# Patient Record
Sex: Female | Born: 1980 | Race: Black or African American | Hispanic: No | Marital: Single | Smoking: Current every day smoker
Health system: Southern US, Community
[De-identification: ages and names within clinical notes are randomized; demographics above are authoritative.]

---

## 2000-12-30 ENCOUNTER — Emergency Department (HOSPITAL_COMMUNITY): Admission: EM | Admit: 2000-12-30 | Discharge: 2000-12-30 | Payer: Self-pay | Admitting: Emergency Medicine

## 2012-06-16 ENCOUNTER — Emergency Department (HOSPITAL_COMMUNITY)
Admission: EM | Admit: 2012-06-16 | Discharge: 2012-06-17 | Disposition: A | Discharge status: Home / Self Care | Payer: Self-pay | Attending: Emergency Medicine | Admitting: Emergency Medicine

## 2012-06-16 ENCOUNTER — Encounter (HOSPITAL_COMMUNITY): Payer: Self-pay | Admitting: *Deleted

## 2012-06-16 DIAGNOSIS — F17200 Nicotine dependence, unspecified, uncomplicated: Secondary | ICD-10-CM | POA: Insufficient documentation

## 2012-06-16 DIAGNOSIS — R110 Nausea: Secondary | ICD-10-CM | POA: Insufficient documentation

## 2012-06-16 DIAGNOSIS — N898 Other specified noninflammatory disorders of vagina: Secondary | ICD-10-CM | POA: Insufficient documentation

## 2012-06-16 DIAGNOSIS — N390 Urinary tract infection, site not specified: Secondary | ICD-10-CM | POA: Insufficient documentation

## 2012-06-16 DIAGNOSIS — A599 Trichomoniasis, unspecified: Secondary | ICD-10-CM | POA: Insufficient documentation

## 2012-06-16 DIAGNOSIS — Z3202 Encounter for pregnancy test, result negative: Secondary | ICD-10-CM | POA: Insufficient documentation

## 2012-06-16 LAB — URINALYSIS, ROUTINE W REFLEX MICROSCOPIC
Bilirubin Urine: NEGATIVE
Glucose, UA: NEGATIVE mg/dL
Protein, ur: NEGATIVE mg/dL
pH: 6 (ref 5.0–8.0)

## 2012-06-16 LAB — WET PREP, GENITAL
Clue Cells Wet Prep HPF POC: NONE SEEN
Yeast Wet Prep HPF POC: NONE SEEN

## 2012-06-16 LAB — URINE MICROSCOPIC-ADD ON

## 2012-06-16 NOTE — ED Notes (Signed)
Pt states she found out her boyfriend has another partner, been having abdominal pain, clear vaginal discharge and nausea x 1 week. Denies urinary symptoms, vomiting or diarrhea.

## 2012-06-16 NOTE — ED Provider Notes (Signed)
History     CSN: [id number]  Arrival date & time 06/16/12  1657   First MD Initiated Contact with Patient 06/16/12 2201      Chief Complaint  Patient presents with  . Abdominal Pain  . Nausea  . Vaginal Discharge    (Consider location/radiation/quality/duration/timing/severity/associated sxs/prior treatment) HPI Comments: Pt states that her boyfriend was recently tested for STDs and was found to have trichomonas that he contracted from another woman.   The patient is concerned that she may have an STD, as well.  Patient is a 32 y.o. female presenting with abdominal pain and vaginal discharge. The history is provided by the patient.  Abdominal Pain The primary symptoms of the illness include abdominal pain and vaginal discharge. The primary symptoms of the illness do not include fever, fatigue, nausea or dysuria.  The vaginal discharge is not associated with dysuria.  Symptoms associated with the illness do not include chills, diaphoresis, hematuria or frequency.  Vaginal Discharge Associated symptoms include abdominal pain. Pertinent negatives include no chills, diaphoresis, fatigue, fever or nausea.    History reviewed. No pertinent past medical history.  History reviewed. No pertinent past surgical history.  History reviewed. No pertinent family history.  History  Substance Use Topics  . Smoking status: Current Every Day Smoker  . Smokeless tobacco: Never Used  . Alcohol Use: No    OB History    Grav Para Term Preterm Abortions TAB SAB Ect Mult Living                  Review of Systems  Constitutional: Negative for fever, chills, diaphoresis and fatigue.  HENT: Negative.   Eyes: Negative.   Respiratory: Negative.   Cardiovascular: Negative.   Gastrointestinal: Positive for abdominal pain. Negative for nausea.  Genitourinary: Positive for vaginal discharge. Negative for dysuria, frequency, hematuria, flank pain, genital sores, vaginal pain and pelvic pain.    Musculoskeletal: Negative.   Neurological: Negative.   Psychiatric/Behavioral: Negative.   All other systems reviewed and are negative.    Allergies  Review of patient's allergies indicates no known allergies.  Home Medications  No current outpatient prescriptions on file.  BP 137/93  Pulse 101  Temp 98.3 F (36.8 C) (Oral)  Resp 18  SpO2 100%  LMP 06/02/2012  Physical Exam  Nursing note and vitals reviewed. Constitutional: She is oriented to person, place, and time. She appears well-developed and well-nourished. No distress.  HENT:  Head: Normocephalic and atraumatic.  Eyes: Conjunctivae normal are normal. Pupils are equal, round, and reactive to light.  Neck: Normal range of motion.  Cardiovascular: Normal rate and regular rhythm.   Pulmonary/Chest: Effort normal and breath sounds normal. No stridor.  Abdominal: Soft.       Pt states that she has abdominal pain, but on exam I did not find this.  Genitourinary: Uterus normal. Vaginal discharge found.       Some white frothy discharge in the vagina on exam.  Musculoskeletal: Normal range of motion. She exhibits no edema.  Neurological: She is alert and oriented to person, place, and time. No cranial nerve deficit.  Skin: Skin is warm and dry. She is not diaphoretic.  Psychiatric: She has a normal mood and affect. Her behavior is normal.    ED Course  Procedures (including critical care time)   Labs Reviewed  WET PREP, GENITAL  GC/CHLAMYDIA PROBE AMP  RPR  URINALYSIS, ROUTINE W REFLEX MICROSCOPIC   No results found.   No diagnosis found.  MDM  Urine reviewed, trichomonas and bacteria present.  Treating for both STD and UTI.  Recommend follow up for test of cure and use of barrier method for birth control.        Arman Filter, NP 06/17/12 [phone]

## 2012-06-17 LAB — GC/CHLAMYDIA PROBE AMP: CT Probe RNA: POSITIVE — AB

## 2012-06-17 MED ORDER — SULFAMETHOXAZOLE-TMP DS 800-160 MG PO TABS
1.0000 | ORAL_TABLET | Freq: Once | ORAL | Status: AC
  Administered 2012-06-17: 1 via ORAL
  Filled 2012-06-17: qty 1

## 2012-06-17 MED ORDER — METRONIDAZOLE 500 MG PO TABS
500.0000 mg | ORAL_TABLET | Freq: Once | ORAL | Status: AC
  Administered 2012-06-17: 500 mg via ORAL
  Filled 2012-06-17: qty 1

## 2012-06-17 MED ORDER — METRONIDAZOLE 500 MG PO TABS: 500.0000 mg | ORAL_TABLET | Freq: Two times a day (BID) | ORAL | Status: AC

## 2012-06-17 MED ORDER — SULFAMETHOXAZOLE-TMP DS 800-160 MG PO TABS: 1.0000 | ORAL_TABLET | Freq: Two times a day (BID) | ORAL | Status: AC

## 2012-06-17 NOTE — Discharge Instructions (Signed)
Urinary Tract Infection A urinary tract infection (UTI) is often caused by a germ (bacteria). A UTI is usually helped with medicine (antibiotics) that kills germs. Take all the medicine until it is gone. Do this even if you are feeling better. You are usually better in 7 to 10 days. HOME CARE   Drink enough water and fluids to keep your pee (urine) clear or pale yellow. Drink:  Cranberry juice.  Water.  Avoid:  Caffeine.  Tea.  Bubbly (carbonated) drinks.  Alcohol.  Only take medicine as told by your doctor.  To prevent further infections:  Pee often.  After pooping (bowel movement), women should wipe from front to back. Use each tissue only once.  Pee before and after having sex (intercourse). Ask your doctor when your test results will be ready. Make sure you follow up and get your test results.  GET HELP RIGHT AWAY IF:   There is very bad back pain or lower belly (abdominal) pain.  You get the chills.  You have a fever.  Your baby is older than 3 months with a rectal temperature of 102 F (38.9 C) or higher.  Your baby is 27 months old or younger with a rectal temperature of 100.4 F (38 C) or higher.  You feel sick to your stomach (nauseous) or throw up (vomit).  There is continued burning with peeing.  Your problems are not better in 3 days. Return sooner if you are getting worse. MAKE SURE YOU:   Understand these instructions.  Will watch your condition.  Will get help right away if you are not doing well or get worse. Document Released: 11/06/2007 Document Revised: 08/12/2011 Document Reviewed: 11/06/2007 [facility] Patient Information 2013 [place], [place]. Trichomoniasis Trichomoniasis is an infection, caused by the Trichomonas organism, that affects both women and men. In women, the outer female genitalia and the vagina are affected. In men, the penis is mainly affected, but the prostate and other reproductive organs can also be involved.  Trichomoniasis is a sexually transmitted disease (STD) and is most often passed to another person through sexual contact. The majority of people who get trichomoniasis do so from a sexual encounter and are also at risk for other STDs. CAUSES   Sexual intercourse with an infected partner.  It can be present in swimming pools or hot tubs. SYMPTOMS   Abnormal gray-green frothy vaginal discharge in women.  Vaginal itching and irritation in women.  Itching and irritation of the area outside the vagina in women.  Penile discharge with or without pain in males.  Inflammation of the urethra (urethritis), causing painful urination.  Bleeding after sexual intercourse. RELATED COMPLICATIONS  Pelvic inflammatory disease.  Infection of the uterus (endometritis).  Infertility.  Tubal (ectopic) pregnancy.  It can be associated with other STDs, including gonorrhea and chlamydia, hepatitis B, and HIV. COMPLICATIONS DURING PREGNANCY  Early (premature) delivery.  Premature rupture of the membranes (PROM).  Low birth weight. DIAGNOSIS   Visualization of Trichomonas under the microscope from the vagina discharge.  Ph of the vagina greater than 4.5, tested with a test tape.  Trich Rapid Test.  Culture of the organism, but this is not usually needed.  It may be found on a Pap test.  Having a "strawberry cervix,"which means the cervix looks very red like a strawberry. TREATMENT   You may be given medication to fight the infection. Inform your caregiver if you could be or are pregnant. Some medications used to treat the infection should not be  taken during pregnancy.  Over-the-counter medications or creams to decrease itching or irritation may be recommended.  Your sexual partner will need to be treated if infected. HOME CARE INSTRUCTIONS   Take all medication prescribed by your caregiver.  Take over-the-counter medication for itching or irritation as directed by your  caregiver.  Do not have sexual intercourse while you have the infection.  Do not douche or wear tampons.  Discuss your infection with your partner, as your partner may have acquired the infection from you. Or, your partner may have been the person who transmitted the infection to you.  Have your sex partner examined and treated if necessary.  Practice safe, informed, and protected sex.  See your caregiver for other STD testing. SEEK MEDICAL CARE IF:   You still have symptoms after you finish the medication.  You have an oral temperature above 102 F (38.9 C).  You develop belly (abdominal) pain.  You have pain when you urinate.  You have bleeding after sexual intercourse.  You develop a rash.  The medication makes you sick or makes you throw up (vomit). Document Released: 11/13/2000 Document Revised: 08/12/2011 Document Reviewed: 12/09/2008 [facility] Patient Information 2013 [place], [place]. Trichomoniasis Trichomoniasis is an infection, caused by the Trichomonas organism, that affects both women and men. In women, the outer female genitalia and the vagina are affected. In men, the penis is mainly affected, but the prostate and other reproductive organs can also be involved. Trichomoniasis is a sexually transmitted disease (STD) and is most often passed to another person through sexual contact. The majority of people who get trichomoniasis do so from a sexual encounter and are also at risk for other STDs. CAUSES   Sexual intercourse with an infected partner.  It can be present in swimming pools or hot tubs. SYMPTOMS   Abnormal gray-green frothy vaginal discharge in women.  Vaginal itching and irritation in women.  Itching and irritation of the area outside the vagina in women.  Penile discharge with or without pain in males.  Inflammation of the urethra (urethritis), causing painful urination.  Bleeding after sexual intercourse. RELATED COMPLICATIONS  Pelvic  inflammatory disease.  Infection of the uterus (endometritis).  Infertility.  Tubal (ectopic) pregnancy.  It can be associated with other STDs, including gonorrhea and chlamydia, hepatitis B, and HIV. COMPLICATIONS DURING PREGNANCY  Early (premature) delivery.  Premature rupture of the membranes (PROM).  Low birth weight. DIAGNOSIS   Visualization of Trichomonas under the microscope from the vagina discharge.  Ph of the vagina greater than 4.5, tested with a test tape.  Trich Rapid Test.  Culture of the organism, but this is not usually needed.  It may be found on a Pap test.  Having a "strawberry cervix,"which means the cervix looks very red like a strawberry. TREATMENT   You may be given medication to fight the infection. Inform your caregiver if you could be or are pregnant. Some medications used to treat the infection should not be taken during pregnancy.  Over-the-counter medications or creams to decrease itching or irritation may be recommended.  Your sexual partner will need to be treated if infected. HOME CARE INSTRUCTIONS   Take all medication prescribed by your caregiver.  Take over-the-counter medication for itching or irritation as directed by your caregiver.  Do not have sexual intercourse while you have the infection.  Do not douche or wear tampons.  Discuss your infection with your partner, as your partner may have acquired the infection from you. Or, your partner  may have been the person who transmitted the infection to you.  Have your sex partner examined and treated if necessary.  Practice safe, informed, and protected sex.  See your caregiver for other STD testing. SEEK MEDICAL CARE IF:   You still have symptoms after you finish the medication.  You have an oral temperature above 102 F (38.9 C).  You develop belly (abdominal) pain.  You have pain when you urinate.  You have bleeding after sexual intercourse.  You develop a  rash.  The medication makes you sick or makes you throw up (vomit). Document Released: 11/13/2000 Document Revised: 08/12/2011 Document Reviewed: 12/09/2008 [facility] Patient Information 2013 [place], [place]. RESOURCE GUIDE  Chronic Pain Problems: Contact [facility]  [phone] Patients need to be referred by their primary care doctor.  Insufficient Money for Medicine: Contact United Way:  call "211."   No Primary Care Doctor: - Call Health Connect  [phone] - can help you locate a primary care doctor that  accepts your insurance, provides certain services, etc. - Physician Referral Service- [phone]  Agencies that provide inexpensive medical care: - [facility]  [phone] - [facility]  [phone] - [facility]  [phone] - Women's Clinic  [phone] - Planned Parenthood  [phone] - [facility]  [phone]  Medicaid-accepting [facility] Providers: - [facility]- [address]  [phone], Mon-Fri 9am-7pm, Sat 9am-1pm - [facility]- [address] [place], Suite [place]  [phone] - [facility]- [address], [address] [place]  [phone] [facility]- [address]  [phone] - Renaye Rakers- [address] [place], [address], [phone]  Only accepts [place] patients after they have their name  applied to their card  Self Pay (no insurance) in [place]: - Sickle Cell Patients: Dr Willey Blade, [facility]  [address] [place], [phone] - [facility]- [address] [place]  [phone]       [facility]- [address]       -     [facility]- see information above (Speak to [employer] if you do not have insurance)       -  [facility]- [address],  [phone]       -  [facility]- [address]  [phone]       -  Dr Julio Sicks-  [address], [place], [phone]       -  Urgent Medical and [facility] - [address], [phone]       -  [facility]- [address] [phone], also [address], [phone]       -    [facility]- [address] [place], [phone], 1st & 3rd Saturday        every month, 10am-1pm  1) Find a Doctor and Pay Out of Pocket Although you won't have to find out who is covered by your insurance plan, it is a good idea to ask around and get recommendations. You will then need to call the office and see if the doctor you have chosen will accept you as a new patient and what types of options they offer for patients who are self-pay. Some doctors offer discounts  or will set up payment plans for their patients who do not have insurance, but you will need to ask so you aren't surprised when you get to your appointment.  2) Contact Your Local Health Department Not all health departments have doctors that can see patients for sick visits, but many do, so it is worth a call to see if yours does. If you don't know where your local health department is, you can check in your phone book. The CDC also has a tool to help you locate your state's health department, and many state websites also have listings of all of their local health departments.  3) Find a Walk-in Clinic If your illness is not likely to be very severe or complicated, you may want to try a walk in clinic. These are popping up all over the country in pharmacies, drugstores, and shopping centers. They're usually staffed by nurse practitioners or physician assistants that have been trained to treat common illnesses and complaints. They're usually fairly quick and inexpensive. However, if you have serious medical issues or chronic medical problems, these are probably not your best option  STD Testing - [facility] Department of [facility], STD  Clinic, [address], [place], phone [phone] or [phone].  Monday - Friday, call for an appointment. [facility] Department of [employer], STD Clinic, [address], [place], phone [phone] or [phone].  Monday - Friday, call for an appointment.  Abuse/Neglect: [facility] Child Abuse Hotline (684)273-7503 [facility] Child Abuse Hotline 657-280-7313 (After Hours)  Emergency Shelter:  [facility] [phone]) [phone]  Maternity Homes: - Room at the [place] [facility] [phone] - [facility] [phone]  MRSA Hotline #:   440 669 4400  [facility] Resources [facility]  United Way [facility] Dept. [address]                 [address]         [address] [place] [address]  Blondell Reveal Phone:  [phone]                                  Phone:  [phone]                   Phone:  [phone]  [facility], [phone] - [facility]- [phone]       -     [facility] in [place], [address]             [phone], Insurance  [place] Child Abuse Hotline 702 602 8686 or (989)125-1609 (After Hours)  Dental Assistance  If unable to pay or uninsured, contact:  [facility]. to become qualified for the adult dental clinic.  Patients with Medicaid: [facility] [phone] [address] Joellyn Quails, 5512077523 [address] [phone]  If unable to pay, or uninsured, contact [facility] [phone] in [place], [phone] in [facility]) to become qualified for the adult dental clinic  Other Low-Cost Community Dental Services: - [facility]- [address] [place], [place], [place], [address], [phone], Ext. [address], 2nd and 4th  Thursday of the month at 6:30am.  10 clients each day by appointment, can sometimes see walk-in patients if someone does not show for an appointment. [facility]- [address] [place], [place], [address], [phone] - [facility] [address], [place], [place], [address], [phone] - [facility]- [phone] [facility]- [phone] [facility] Health [phone]       Behavioral Health Resources in the [facility]  Intensive Outpatient Programs: [facility]      [address] [place], [place] [phone] Both a day and evening program       [facility] Outpatient     [address]        [place], [place] [address] [phone]         ADS: [facility] [address] [place] [phone]  [facility] Mental Health ACCESS LINE: 228-013-9574 or ([phone] [place], [place] [address] [email or web address]  Behavioral Health Services  Substance Abuse Resources: - [facility] Drug [facility]  [phone] - [facility] [phone] - [facility] [place] [phone] Floydene Flock [phone] - Residential & Outpatient Substance Abuse Program  [phone]  Psychological Services: [facility]  [phone] [facility] Services  [phone] - [facility], [phone] [place]. [address], [place], ACCESS LINE: 6362410956 or [phone], [email or web address]  Mobile Crisis Teams:                                        Therapeutic Alternatives         Mobile Crisis Care Unit [phone]             [facility] [address] Ginette Otto [phone]                                         Interventionist [address] [place] [address] [place]  [place] [phone]  Self-Help/Support Groups: Mental Health Assoc. of [employer] of support groups 641-824-2022 (call for more info)   Narcotics Anonymous (NA) Caring Services [address] [place] - 2 meetings at this location  Residential Treatment Programs:  [facility]      [address]        [place]       [phone]         [facility] [address] [place] [address] [place], [place]  [address] [phone]  [facility]  [address] [place], [place] [address] [phone] Admissions: 8am-3pm M-F  [facility]     [address]        [place], [place] [address]       [phone]         [facility] [address] [place], [place] [phone]  [facility] [address] [place], [place] [phone]  [facility] -  Intensive Outpatient      [address]     [place], [place]       [phone]         [facility] ([facility]     [address] [place], [place] [phone] or [phone]  Residential Treatment Services (RTS), Medicaid [address] [place], [place] [phone]  [facility] [address]                                               [address] [place] [phone]  [facility] Resources: [place] [phone]               General Therapy                                                Angie Fava, PhD        [address] [place], [place] [address]         [phone]   Insurance  [facility]   [address] [place], [place] [address] [phone]  [facility] [address] [place], [place] [address] [phone] Insurance/Medicaid/sponsorship through [facility] and Families                                              [address]                                         [place], [place] [address]    Therapy/tele-psych/case         [phone]          [facility] [address] [place], [place]  [address]  Adolescent/group home/case management [phone]                                           Creola Corn PhD       General therapy       Insurance   [phone]         Dr. Lolly Mustache, Insurance, M-F [phone]   While your infection is active, please use a barrier method of contraception (ie condom) with each encounter until you have followed up with health department or PCP for test of cure.

## 2012-06-17 NOTE — ED Provider Notes (Signed)
Medical screening examination/treatment/procedure(s) were performed by non-physician practitioner and as supervising physician I was immediately available for consultation/collaboration.   Sunnie Nielsen, MD 06/17/12 0100

## 2012-06-18 NOTE — ED Notes (Signed)
+  Chlamydia Chart sent to EDP office for review.  

## 2012-06-19 LAB — URINE CULTURE

## 2012-06-20 NOTE — ED Notes (Signed)
+  Urine. Patient treated with Bactrim DS. Sensitive to same. Per protocol MD. °

## 2012-06-24 NOTE — ED Notes (Signed)
Chart returned from EDP office . rx for zithromax 2 gm po x 1 per Margaret Doyle.

## 2012-06-27 ENCOUNTER — Telehealth (HOSPITAL_COMMUNITY): Payer: Self-pay | Admitting: Emergency Medicine

## 2012-06-28 NOTE — ED Notes (Signed)
Unable to contact patient via phone. Sent letter. °

## 2012-08-23 ENCOUNTER — Telehealth (HOSPITAL_COMMUNITY): Payer: Self-pay | Admitting: Emergency Medicine

## 2012-08-23 NOTE — ED Notes (Signed)
No response to letter sent after 30 days. Chart sent to Medical Records. °

## 2015-03-15 ENCOUNTER — Encounter (HOSPITAL_COMMUNITY): Payer: Self-pay | Admitting: Emergency Medicine

## 2015-03-15 ENCOUNTER — Emergency Department (HOSPITAL_COMMUNITY)
Admission: EM | Admit: 2015-03-15 | Discharge: 2015-03-15 | Disposition: A | Discharge status: Home / Self Care | Payer: Self-pay | Attending: Emergency Medicine | Admitting: Emergency Medicine

## 2015-03-15 ENCOUNTER — Emergency Department (HOSPITAL_COMMUNITY): Payer: Self-pay

## 2015-03-15 DIAGNOSIS — Z792 Long term (current) use of antibiotics: Secondary | ICD-10-CM | POA: Insufficient documentation

## 2015-03-15 DIAGNOSIS — Y998 Other external cause status: Secondary | ICD-10-CM | POA: Insufficient documentation

## 2015-03-15 DIAGNOSIS — Z720 Tobacco use: Secondary | ICD-10-CM | POA: Insufficient documentation

## 2015-03-15 DIAGNOSIS — W208XXA Other cause of strike by thrown, projected or falling object, initial encounter: Secondary | ICD-10-CM | POA: Insufficient documentation

## 2015-03-15 DIAGNOSIS — Y9389 Activity, other specified: Secondary | ICD-10-CM | POA: Insufficient documentation

## 2015-03-15 DIAGNOSIS — Y9289 Other specified places as the place of occurrence of the external cause: Secondary | ICD-10-CM | POA: Insufficient documentation

## 2015-03-15 DIAGNOSIS — S96912A Strain of unspecified muscle and tendon at ankle and foot level, left foot, initial encounter: Secondary | ICD-10-CM | POA: Insufficient documentation

## 2015-03-15 MED ORDER — IBUPROFEN 800 MG PO TABS: 800.0000 mg | ORAL_TABLET | Freq: Three times a day (TID) | ORAL | Status: AC

## 2015-03-15 MED ORDER — TRAMADOL HCL 50 MG PO TABS: 50.0000 mg | ORAL_TABLET | Freq: Two times a day (BID) | ORAL | Status: AC | PRN

## 2015-03-15 MED ORDER — HYDROCODONE-ACETAMINOPHEN 5-325 MG PO TABS
2.0000 | ORAL_TABLET | Freq: Once | ORAL | Status: AC
  Administered 2015-03-15: 2 via ORAL
  Filled 2015-03-15: qty 2

## 2015-03-15 NOTE — Discharge Instructions (Signed)
Ankle Sprain  An ankle sprain is an injury to the strong, fibrous tissues (ligaments) that hold the bones of your ankle joint together.   CAUSES  An ankle sprain is usually caused by a fall or by twisting your ankle. Ankle sprains most commonly occur when you step on the outer edge of your foot, and your ankle turns inward. People who participate in sports are more prone to these types of injuries.   SYMPTOMS    Pain in your ankle. The pain may be present at rest or only when you are trying to stand or walk.   Swelling.   Bruising. Bruising may develop immediately or within 1 to 2 days after your injury.   Difficulty standing or walking, particularly when turning corners or changing directions.  DIAGNOSIS   Your caregiver will ask you details about your injury and perform a physical exam of your ankle to determine if you have an ankle sprain. During the physical exam, your caregiver will press on and apply pressure to specific areas of your foot and ankle. Your caregiver will try to move your ankle in certain ways. An X-ray exam may be done to be sure a bone was not broken or a ligament did not separate from one of the bones in your ankle (avulsion fracture).   TREATMENT   Certain types of braces can help stabilize your ankle. Your caregiver can make a recommendation for this. Your caregiver may recommend the use of medicine for pain. If your sprain is severe, your caregiver may refer you to a surgeon who helps to restore function to parts of your skeletal system (orthopedist) or a physical therapist.  HOME CARE INSTRUCTIONS    Apply ice to your injury for 1-2 days or as directed by your caregiver. Applying ice helps to reduce inflammation and pain.    Put ice in a plastic bag.    Place a towel between your skin and the bag.    Leave the ice on for 15-20 minutes at a time, every 2 hours while you are awake.   Only take over-the-counter or prescription medicines for pain, discomfort, or fever as directed by  your caregiver.   Elevate your injured ankle above the level of your heart as much as possible for 2-3 days.   If your caregiver recommends crutches, use them as instructed. Gradually put weight on the affected ankle. Continue to use crutches or a cane until you can walk without feeling pain in your ankle.   If you have a plaster splint, wear the splint as directed by your caregiver. Do not rest it on anything harder than a pillow for the first 24 hours. Do not put weight on it. Do not get it wet. You may take it off to take a shower or bath.   You may have been given an elastic bandage to wear around your ankle to provide support. If the elastic bandage is too tight (you have numbness or tingling in your foot or your foot becomes cold and blue), adjust the bandage to make it comfortable.   If you have an air splint, you may blow more air into it or let air out to make it more comfortable. You may take your splint off at night and before taking a shower or bath. Wiggle your toes in the splint several times per day to decrease swelling.  SEEK MEDICAL CARE IF:    You have rapidly increasing bruising or swelling.   Your toes feel   extremely cold or you lose feeling in your foot.   Your pain is not relieved with medicine.  SEEK IMMEDIATE MEDICAL CARE IF:   Your toes are numb or blue.   You have severe pain that is increasing.  MAKE SURE YOU:    Understand these instructions.   Will watch your condition.   Will get help right away if you are not doing well or get worse.     This information is not intended to replace advice given to you by your health care provider. Make sure you discuss any questions you have with your health care provider.     Document Released: 05/20/2005 Document Revised: 06/10/2014 Document Reviewed: 06/01/2011  Elsevier Interactive Patient Education 2016 [employer]

## 2015-03-15 NOTE — ED Provider Notes (Signed)
CSN: [id number]     Arrival date & time 03/15/15  1708 History  By signing my name below, I, Margaret Doyle, attest that this documentation has been prepared under the direction and in the presence of Margaret BerryLeisa Elly Haffey, PA-C. Electronically Signed: Evon Slackerrance Doyle, ED Scribe. 03/15/2015. 6:19 PM.  Chief Complaint  Patient presents with  . Ankle Pain   The history is provided by the patient. No language interpreter was used.    HPI Comments: Margaret GurneyRayshaune Doyle Margaret Doyle is a 34 y.o. female who presents to the Emergency Department complaining of left ankle injury onset 1 night prior. Pt rates the the severity of her pain 8/10. Pt states she was moving furniture last night and she dropped a dresser on her left ankle. Pt presents with associated swelling. Pt states she has tried a compression wrap with no relief. Pt states she has also tried an epsom salt bath and icing the ankle with slight relief. Pt states she is able to bear weight but states that makes the pain worse. Pt denies any medications PTA. Pt denies numbness or tingling.    History reviewed. No pertinent past medical history. History reviewed. No pertinent past surgical history. No family history on file. Social History  Substance Use Topics  . Smoking status: Current Every Day Smoker  . Smokeless tobacco: Never Used  . Alcohol Use: No   OB History    No data available     Review of Systems  Musculoskeletal: Positive for joint swelling and arthralgias. Negative for gait problem.  Neurological: Negative for numbness.    Allergies  Review of patient's allergies indicates no known allergies.  Home Medications   Prior to Admission medications   Medication Sig Start Date End Date Taking? Authorizing Provider  ibuprofen (ADVIL,MOTRIN) 800 MG tablet Take 1 tablet (800 mg total) by mouth 3 (three) times daily. 03/15/15   Margaret BerryLeisa Carliss Quast, PA-C  metroNIDAZOLE (FLAGYL) 500 MG tablet Take 1 tablet (500 mg total) by mouth 2 (two) times daily. 06/17/12    Earley FavorGail Schulz, NP  sulfamethoxazole-trimethoprim (BACTRIM DS) 800-160 MG per tablet Take 1 tablet by mouth 2 (two) times daily. 06/17/12   Earley FavorGail Schulz, NP  traMADol (ULTRAM) 50 MG tablet Take 1 tablet (50 mg total) by mouth every 12 (twelve) hours as needed for severe pain. 03/15/15   Jannelle Notaro, PA-C   BP 130/69 mmHg  Pulse 93  Temp(Src) 98.6 F (37 C) (Oral)  Resp 20  SpO2 97%  LMP 02/16/2015  Physical Exam  Constitutional: She is oriented to person, place, and time. She appears well-developed and well-nourished. No distress.  HENT:  Head: Normocephalic and atraumatic.  Right Ear: External ear normal.  Left Ear: External ear normal.  Nose: Nose normal.  Mouth/Throat: Oropharynx is clear and moist. No oropharyngeal exudate.  Eyes: Conjunctivae and EOM are normal. Pupils are equal, round, and reactive to light. Right eye exhibits no discharge. Left eye exhibits no discharge. No scleral icterus.  Neck: Normal range of motion. Neck supple. No JVD present. No tracheal deviation present.  Cardiovascular: Normal rate and regular rhythm.   Pulmonary/Chest: Effort normal and breath sounds normal. No stridor. No respiratory distress.  Musculoskeletal: Normal range of motion. She exhibits tenderness. She exhibits no edema.       Left ankle: She exhibits normal range of motion, no swelling, no ecchymosis, no deformity, no laceration and normal pulse. No tenderness. No lateral malleolus and no medial malleolus tenderness found. Achilles tendon normal. Achilles tendon exhibits no pain and  no defect.       Feet:  ttp to lateral ankle proximal to lateral malleolus, no swelling, erythema, deformity  Lymphadenopathy:    She has no cervical adenopathy.  Neurological: She is alert and oriented to person, place, and time. She exhibits normal muscle tone. Coordination normal.  Skin: Skin is warm and dry. No rash noted. She is not diaphoretic. No erythema. No pallor.  Psychiatric: She has a normal mood and  affect. Her behavior is normal. Judgment and thought content normal.  Nursing note and vitals reviewed.  ED Course  Procedures  DIAGNOSTIC STUDIES: Oxygen Saturation is 97% on RA, normal by my interpretation.    COORDINATION OF CARE: 6:23 PM-Discussed treatment plan with pt at bedside and pt agreed to plan.    Labs Review Labs Reviewed - No data to display  Imaging Review No results found.      DG Ankle Complete Left (Final result) Result time: 03/15/15 18:03:03   Final result by Rad Results In Interface (03/15/15 18:03:03)   Narrative:   CLINICAL DATA: Left ankle pain after blunt trauma yesterday. Swelling.  EXAM: LEFT ANKLE COMPLETE - 3+ VIEW  COMPARISON: None.  FINDINGS: There is no evidence of fracture, dislocation, or joint effusion. There is no evidence of arthropathy or other focal bone abnormality. Soft tissues are unremarkable.  IMPRESSION: Negative.   Electronically Signed By: Margaret Doyle M.D. On: 03/15/2015 18:03    I have personally reviewed and evaluated these images as part of my medical decision-making.   EKG Interpretation None      MDM   Final diagnoses:  Ankle strain, left, initial encounter    Left ankle strain, no fx on xray RICE tx reviewed, given ankle brace, crutches and work note Encouraged follow up with PCP if not improved in 1 week  I personally performed the services described in this documentation, which was scribed in my presence. The recorded information has been reviewed and is accurate.    Margaret Berry, PA-C 03/24/15 0110  Margaret Jester, DO 03/24/15 1610

## 2015-03-15 NOTE — ED Notes (Signed)
Pt states she dropped a dresser on her left ankle yesterday. Pain mostly in lower ankle, radiating up high ankle, rates pain 8/10. Mild swelling noted, pedal pulse intact, denies numbness/tingling.

## 2016-08-06 ENCOUNTER — Encounter (HOSPITAL_COMMUNITY): Payer: Self-pay | Admitting: Emergency Medicine

## 2016-08-06 ENCOUNTER — Emergency Department (HOSPITAL_COMMUNITY)
Admission: EM | Admit: 2016-08-06 | Discharge: 2016-08-06 | Disposition: A | Discharge status: Home / Self Care | Payer: Self-pay | Attending: Emergency Medicine | Admitting: Emergency Medicine

## 2016-08-06 DIAGNOSIS — Y999 Unspecified external cause status: Secondary | ICD-10-CM | POA: Insufficient documentation

## 2016-08-06 DIAGNOSIS — Y929 Unspecified place or not applicable: Secondary | ICD-10-CM | POA: Insufficient documentation

## 2016-08-06 DIAGNOSIS — S0012XA Contusion of left eyelid and periocular area, initial encounter: Secondary | ICD-10-CM | POA: Insufficient documentation

## 2016-08-06 DIAGNOSIS — S060X0A Concussion without loss of consciousness, initial encounter: Secondary | ICD-10-CM | POA: Insufficient documentation

## 2016-08-06 DIAGNOSIS — F17200 Nicotine dependence, unspecified, uncomplicated: Secondary | ICD-10-CM | POA: Insufficient documentation

## 2016-08-06 DIAGNOSIS — Y939 Activity, unspecified: Secondary | ICD-10-CM | POA: Insufficient documentation

## 2016-08-06 MED ORDER — KETOROLAC TROMETHAMINE 30 MG/ML IJ SOLN
30.0000 mg | Freq: Once | INTRAMUSCULAR | Status: AC
  Administered 2016-08-06: 30 mg via INTRAVENOUS
  Filled 2016-08-06: qty 1

## 2016-08-06 MED ORDER — METOCLOPRAMIDE HCL 10 MG PO TABS: 10.0000 mg | ORAL_TABLET | Freq: Three times a day (TID) | ORAL | 0 refills | Status: AC | PRN

## 2016-08-06 MED ORDER — DIPHENHYDRAMINE HCL 50 MG/ML IJ SOLN
50.0000 mg | Freq: Once | INTRAMUSCULAR | Status: AC
  Administered 2016-08-06: 50 mg via INTRAVENOUS
  Filled 2016-08-06: qty 1

## 2016-08-06 MED ORDER — METOCLOPRAMIDE HCL 5 MG/ML IJ SOLN
10.0000 mg | Freq: Once | INTRAMUSCULAR | Status: AC
  Administered 2016-08-06: 10 mg via INTRAVENOUS
  Filled 2016-08-06: qty 2

## 2016-08-06 NOTE — ED Notes (Signed)
Provider in room  
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
## 2017-04-28 IMAGING — CR DG ANKLE COMPLETE 3+V*L*
3 series · 3 of 3 positions shown · non-contrast
Comparison: None.

CLINICAL DATA: Left ankle pain after blunt trauma yesterday.
Swelling.

EXAM:
LEFT ANKLE COMPLETE - 3+ VIEW

[x ankle ap left]
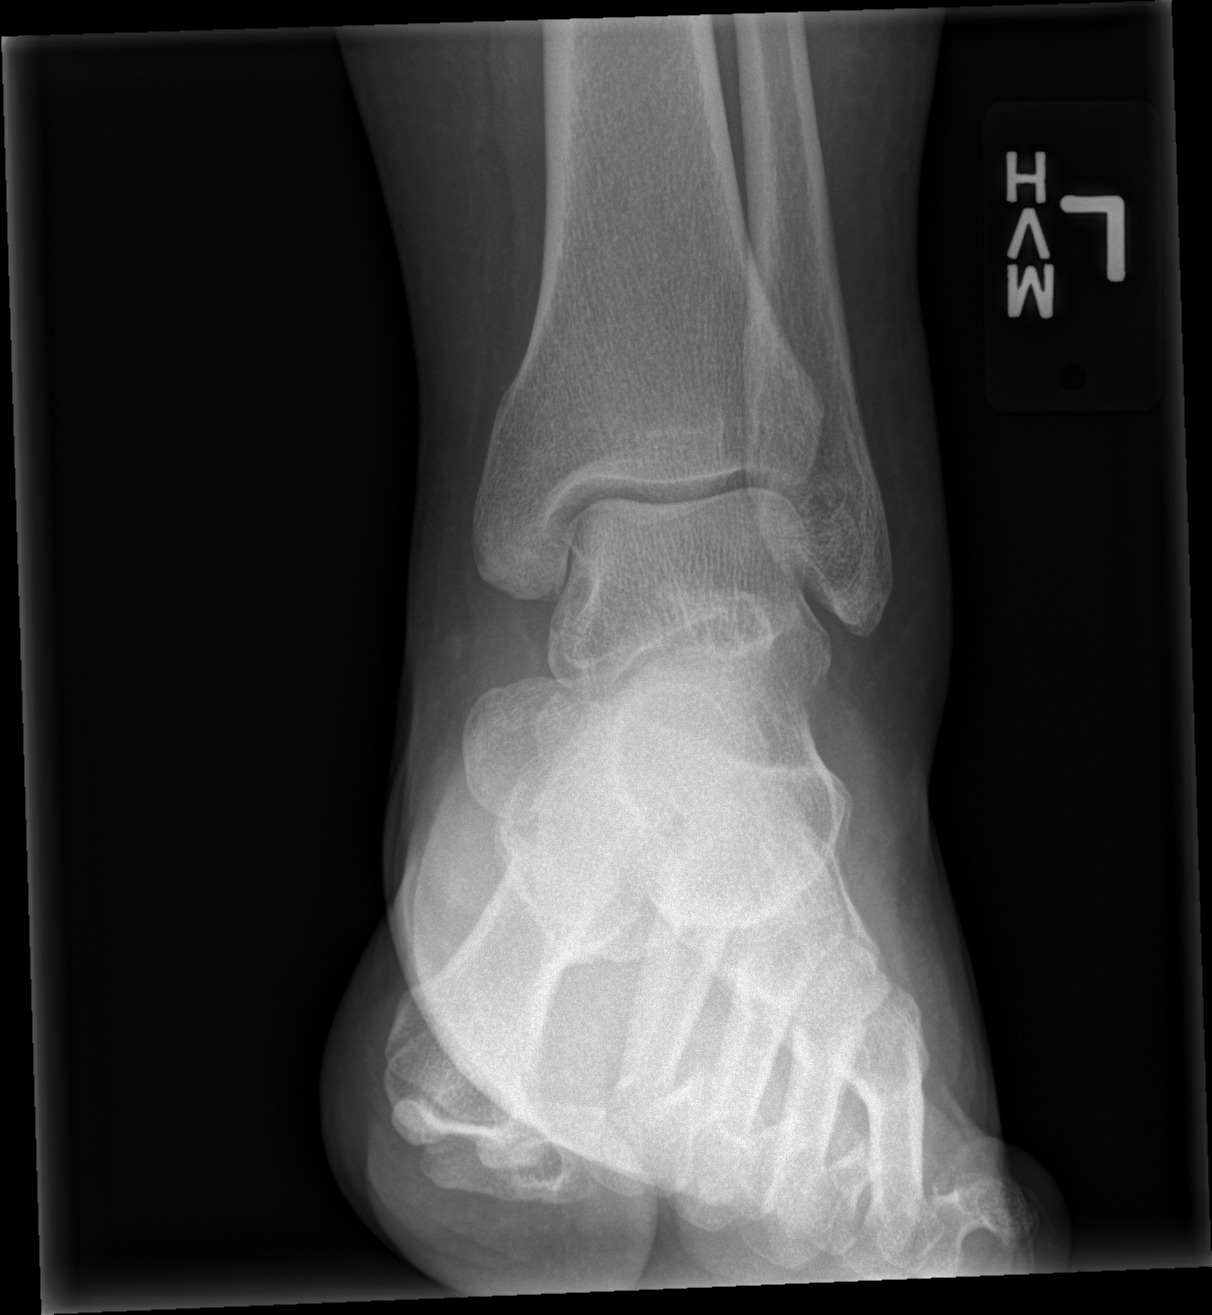

[x ankle obl left]
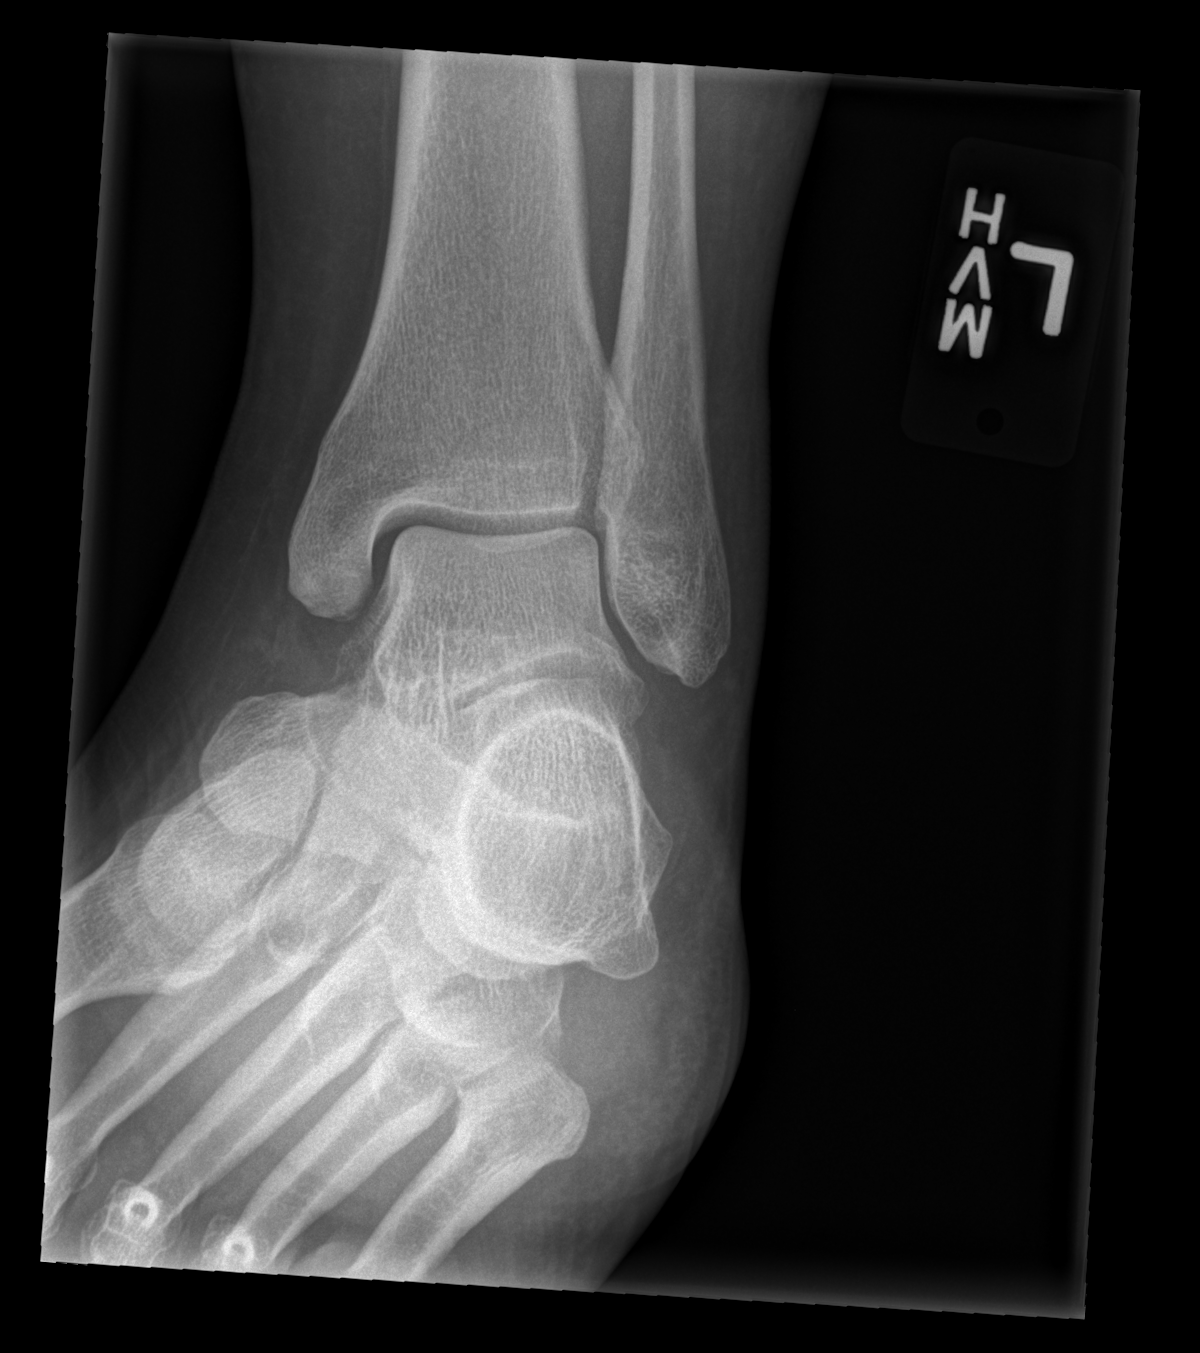

[x ankle lat left]
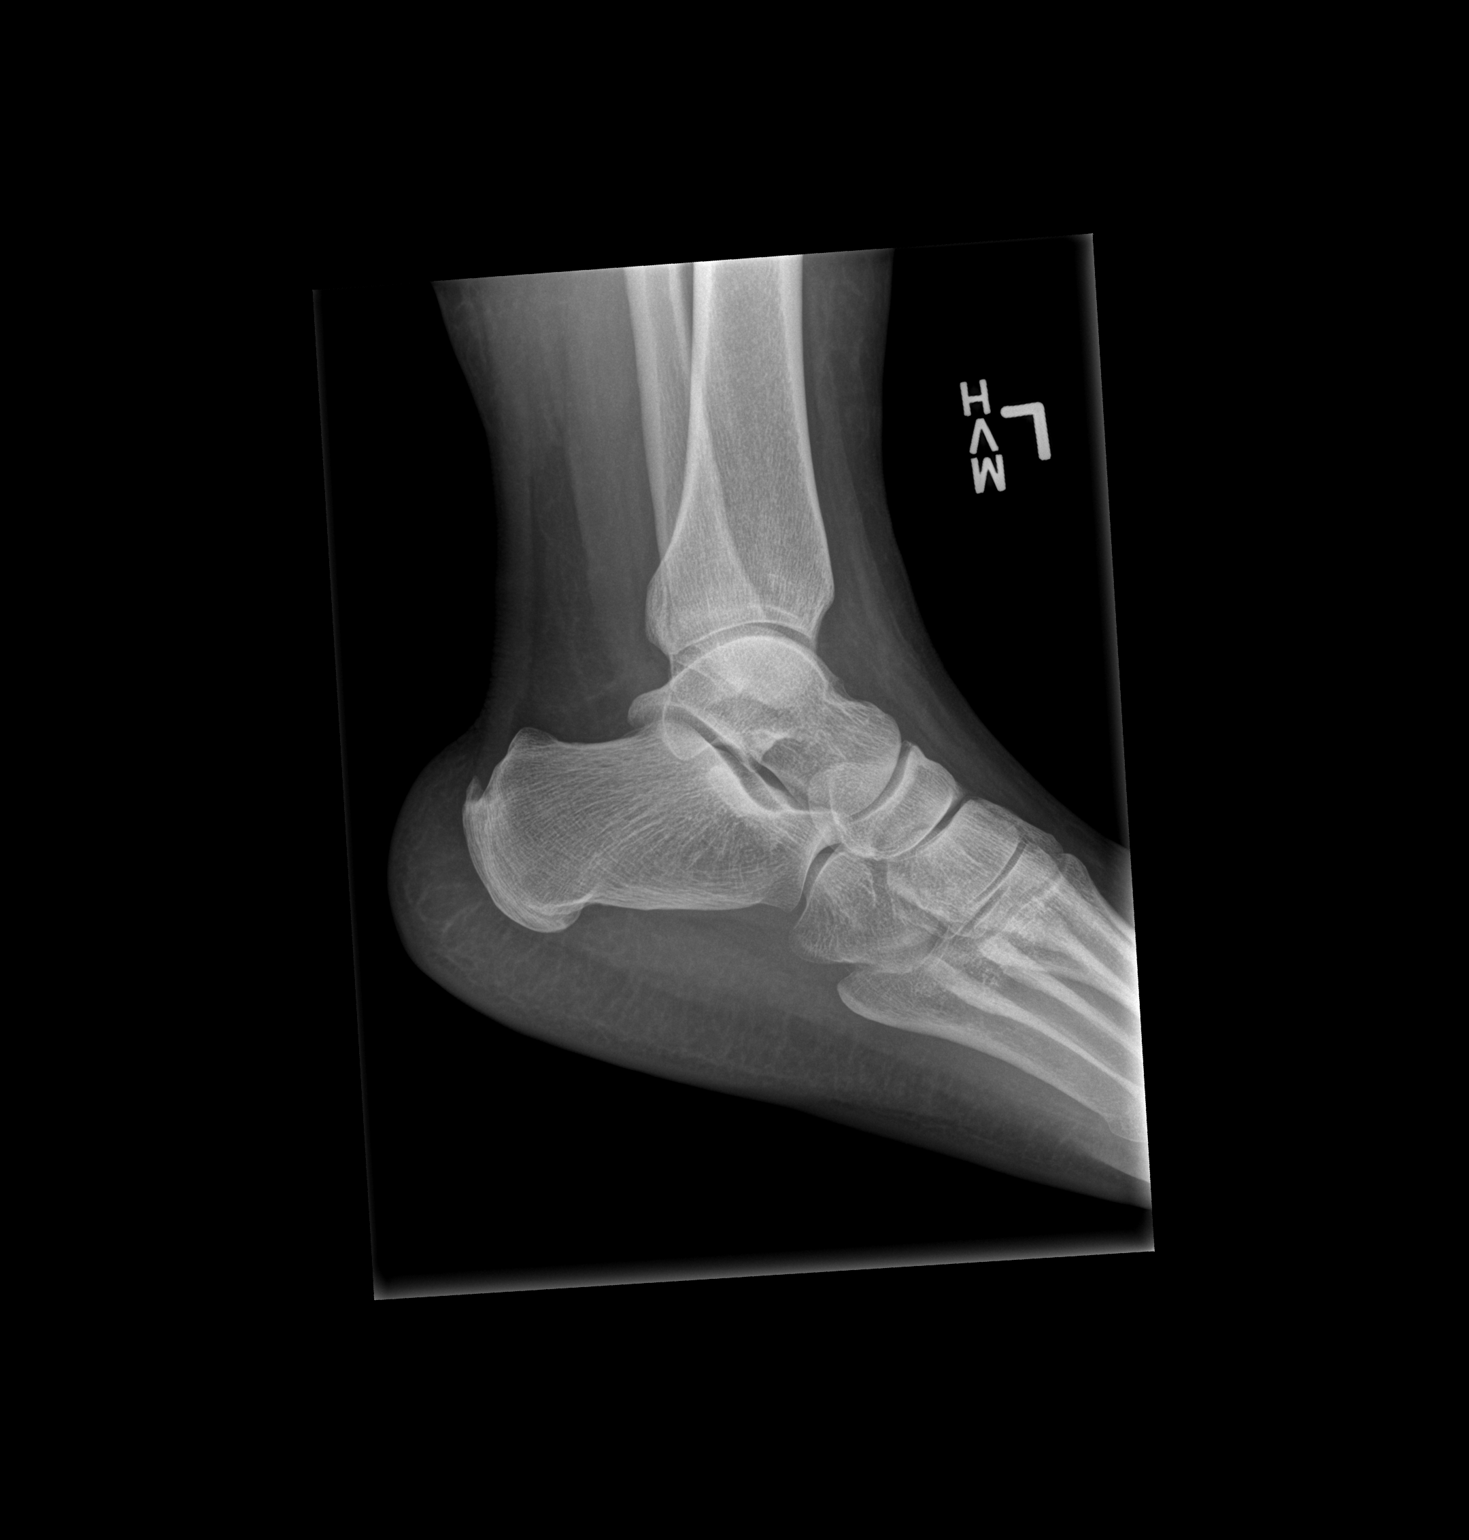

[3 of 3 positions shown; findings below may reference images not displayed]

FINDINGS: There is no evidence of fracture, dislocation, or joint effusion.
There is no evidence of arthropathy or other focal bone abnormality.
Soft tissues are unremarkable.
IMPRESSION: Negative.
